# Patient Record
Sex: Female | Born: 1975
Health system: Southern US, Community
[De-identification: ages and names within clinical notes are randomized; demographics above are authoritative.]

## PROBLEM LIST (undated history)

## (undated) HISTORY — PX: ABDOMINOPLASTY: SUR9

## (undated) HISTORY — PX: BREAST SURGERY: SHX581

---

## 2011-11-06 ENCOUNTER — Emergency Department (HOSPITAL_COMMUNITY)
Admission: EM | Admit: 2011-11-06 | Discharge: 2011-11-06 | Disposition: A | Discharge status: Home / Self Care | Payer: 59 | Source: Home / Self Care | Attending: Emergency Medicine | Admitting: Emergency Medicine

## 2011-11-06 DIAGNOSIS — J029 Acute pharyngitis, unspecified: Secondary | ICD-10-CM

## 2011-11-06 MED ORDER — ACETAMINOPHEN-CODEINE #3 300-30 MG PO TABS: 1.0000 | ORAL_TABLET | Freq: Four times a day (QID) | ORAL | Status: AC | PRN

## 2011-11-06 NOTE — ED Notes (Signed)
C/o sore throat for 3 days.  Denies fever or cold sx.

## 2011-11-06 NOTE — ED Provider Notes (Signed)
History     CSN: [id number]  Arrival date & time 11/06/11  1138   First MD Initiated Contact with Patient 11/06/11 1142      Chief Complaint  Patient presents with  . Sore Throat    (Consider location/radiation/quality/duration/timing/severity/associated sxs/prior treatment) Patient is a 35 y.o. female presenting with pharyngitis. The history is provided by the patient.  Sore Throat The current episode started more than 2 days ago. The problem occurs constantly. The problem has not changed since onset.Pertinent negatives include no chest pain, no abdominal pain, no headaches and no shortness of breath. The symptoms are aggravated by swallowing. The symptoms are relieved by nothing. She has tried nothing for the symptoms.    History reviewed. No pertinent past medical history.  Past Surgical History  Procedure Date  . Cesarean section     No family history on file.  History  Substance Use Topics  . Smoking status: Never Smoker   . Smokeless tobacco: Not on file  . Alcohol Use: No    OB History    Grav Para Term Preterm Abortions TAB SAB Ect Mult Living                  Review of Systems  HENT: Positive for sore throat. Negative for ear pain, congestion, rhinorrhea, trouble swallowing, neck pain, neck stiffness, dental problem, postnasal drip and sinus pressure.   Respiratory: Negative for cough and shortness of breath.   Cardiovascular: Negative for chest pain.  Gastrointestinal: Negative for abdominal pain.  Neurological: Negative for headaches.    Allergies  Review of patient's allergies indicates no known allergies.  Home Medications   Current Outpatient Rx  Name Route Sig Dispense Refill  . ACETAMINOPHEN-CODEINE #3 300-30 MG PO TABS Oral Take 1-2 tablets by mouth every 6 (six) hours as needed for pain. 15 tablet 0    BP 112/61  Pulse 88  Temp(Src) 97.9 F (36.6 C) (Oral)  Resp 18  SpO2 100%  LMP 10/21/2011  Physical Exam  Nursing note and  vitals reviewed. Constitutional: She appears well-developed and well-nourished.  HENT:  Head: Normocephalic.  Mouth/Throat: Uvula is midline. Posterior oropharyngeal erythema present. No oropharyngeal exudate, posterior oropharyngeal edema or tonsillar abscesses.  Eyes: Conjunctivae are normal.  Neck: Normal range of motion.  Pulmonary/Chest: Effort normal and breath sounds normal. She has no decreased breath sounds. She has no wheezes. She has no rhonchi. She has no rales.  Lymphadenopathy:    She has no cervical adenopathy.    ED Course  Procedures (including critical care time)   Labs Reviewed  POCT RAPID STREP A ([facility]ARE ONLY)   No results found.   1. Pharyngitis       MDM  Sore throat- afebrile. No further symptoms- negative strep test-        Jimmie Molly, MD 11/06/11 1325

## 2013-08-31 ENCOUNTER — Other Ambulatory Visit: Payer: Self-pay | Admitting: Obstetrics and Gynecology

## 2013-09-05 ENCOUNTER — Encounter (HOSPITAL_COMMUNITY): Payer: Self-pay | Admitting: Pharmacist

## 2013-09-07 ENCOUNTER — Other Ambulatory Visit: Payer: Self-pay | Admitting: Obstetrics and Gynecology

## 2013-09-18 ENCOUNTER — Encounter (HOSPITAL_COMMUNITY): Payer: Self-pay | Admitting: Obstetrics and Gynecology

## 2013-09-18 DIAGNOSIS — N920 Excessive and frequent menstruation with regular cycle: Secondary | ICD-10-CM

## 2013-09-18 NOTE — H&P (Signed)
Christy Phillips is an 37 y.o. female. Presenting for endometrial ablation for disruptive menorrhagia   Pertinent Gynecological History: Menses: regular every 28 days without intermenstrual spotting and usually lasting less than 6 days Contraception: tubal ligation Blood transfusions: none Sexually transmitted diseases: no past history Previous GYN Procedures: tubal ligation  Last pap: normal Date: 07/2013 OB History: G4, P3   Menstrual History: Menarche age: 71  No LMP recorded.    History reviewed. No pertinent past medical history.  Past Surgical History  Procedure Laterality Date  . Cesarean section    . Abdominoplasty    . Breast surgery      reduction 07    History reviewed. No pertinent family history.  Social History:  reports that she has never smoked. She does not have any smokeless tobacco history on file. She reports that she does not drink alcohol or use illicit drugs.  Allergies:  Allergies  Allergen Reactions  . Shellfish Allergy     No prescriptions prior to admission    Review of Systems  Constitutional: Negative.   HENT: Negative.   Eyes: Negative.   Respiratory: Negative.   Cardiovascular: Negative.   Gastrointestinal: Negative.   Genitourinary: Negative.   Musculoskeletal: Negative.   Skin: Negative.   Neurological: Negative.   Endo/Heme/Allergies: Negative.   Psychiatric/Behavioral: Negative.     Blood pressure 121/84, pulse 92, temperature 97.7 F (36.5 C), temperature source Oral, resp. rate 20, height 5\' 8"  (1.727 m), weight 240 lb (108.863 kg), SpO2 100.00%. Physical Exam  Constitutional: She is oriented to person, place, and time. She appears well-developed and well-nourished.  HENT:  Head: Normocephalic and atraumatic.  Eyes: Conjunctivae and EOM are normal.  Neck: Normal range of motion. Neck supple.  Cardiovascular: Normal rate, regular rhythm and normal heart sounds.   Respiratory: Effort normal and breath sounds normal.  GI:  Soft. Bowel sounds are normal.  Genitourinary: Vagina normal.  Uterus enlarged to 8 weeks size  Musculoskeletal: Normal range of motion.  Neurological: She is alert and oriented to person, place, and time.  Skin: Skin is warm and dry.  Psychiatric: She has a normal mood and affect.    Results for orders placed during the hospital encounter of 09/19/13 (from the past 24 hour(s))  PREGNANCY, URINE     Status: None   Collection Time    09/19/13  9:55 AM      Result Value Range   Preg Test, Ur NEGATIVE  NEGATIVE  CBC     Status: None   Collection Time    09/19/13 10:30 AM      Result Value Range   WBC 4.8  4.0 - 10.5 K/uL   RBC 4.84  3.87 - 5.11 MIL/uL   Hemoglobin 13.3  12.0 - 15.0 g/dL   HCT 78.2  95.6 - 21.3 %   MCV 78.7  78.0 - 100.0 fL   MCH 27.5  26.0 - 34.0 pg   MCHC 34.9  30.0 - 36.0 g/dL   RDW 08.6  57.8 - 46.9 %   Platelets 252  150 - 400 K/uL   Office workup:  TSH nl   Pap smear 9/14 WNL   Endometrial biopsy nl   U/S and sonohysterogram:  No endometrial lesions.  Posterior intramural fibroid  Assessment/Plan: Dispruptive menorrhagia Small uterine fibroid    The various methods of treatment have been discussed with the patient and family. After consideration of risks, benefits and other options for treatment, the patient has consented to  Procedure(s): THERMACHOICE ABLATION as a surgical intervention .  I have reviewed the patients' chart and labs.  Questions were answered to the patient's satisfaction.  The pt understands the peculiar risk of this procedure to be uterine perforation and understands this would preclude completion of the procedure.    HAYGOOD,VANESSA P 09/19/2013, 12:53 PM

## 2013-09-19 ENCOUNTER — Encounter (HOSPITAL_COMMUNITY): Payer: Self-pay | Admitting: *Deleted

## 2013-09-19 ENCOUNTER — Ambulatory Visit (HOSPITAL_COMMUNITY)
Admission: RE | Admit: 2013-09-19 | Discharge: 2013-09-19 | Disposition: A | Discharge status: Home / Self Care | Payer: 59 | Source: Ambulatory Visit | Attending: Obstetrics and Gynecology | Admitting: Obstetrics and Gynecology

## 2013-09-19 ENCOUNTER — Encounter (HOSPITAL_COMMUNITY)
Admission: RE | Disposition: A | Discharge status: Home / Self Care | Payer: Self-pay | Source: Ambulatory Visit | Attending: Obstetrics and Gynecology

## 2013-09-19 ENCOUNTER — Ambulatory Visit (HOSPITAL_COMMUNITY): Payer: 59 | Admitting: Certified Registered Nurse Anesthetist

## 2013-09-19 ENCOUNTER — Encounter (HOSPITAL_COMMUNITY): Payer: 59 | Admitting: Certified Registered Nurse Anesthetist

## 2013-09-19 DIAGNOSIS — N920 Excessive and frequent menstruation with regular cycle: Secondary | ICD-10-CM

## 2013-09-19 DIAGNOSIS — D251 Intramural leiomyoma of uterus: Secondary | ICD-10-CM | POA: Insufficient documentation

## 2013-09-19 HISTORY — PX: HYSTEROSCOPY WITH THERMACHOICE: SHX5396

## 2013-09-19 LAB — CBC
MCV: 78.7 fL (ref 78.0–100.0)
Platelets: 252 10*3/uL (ref 150–400)
RDW: 13.9 % (ref 11.5–15.5)
WBC: 4.8 10*3/uL (ref 4.0–10.5)

## 2013-09-19 LAB — PREGNANCY, URINE: Preg Test, Ur: NEGATIVE

## 2013-09-19 SURGERY — HYSTEROSCOPY WITH THERMACHOICE
Anesthesia: General | Wound class: Clean Contaminated

## 2013-09-19 MED ORDER — KETOROLAC TROMETHAMINE 30 MG/ML IJ SOLN
INTRAMUSCULAR | Status: AC
  Filled 2013-09-19: qty 1

## 2013-09-19 MED ORDER — METOCLOPRAMIDE HCL 5 MG/ML IJ SOLN: 10.0000 mg | Freq: Once | INTRAMUSCULAR | Status: DC | PRN

## 2013-09-19 MED ORDER — LIDOCAINE HCL 2 % IJ SOLN
INTRAMUSCULAR | Status: AC
  Filled 2013-09-19: qty 20

## 2013-09-19 MED ORDER — KETOROLAC TROMETHAMINE 30 MG/ML IJ SOLN
INTRAMUSCULAR | Status: DC | PRN
  Administered 2013-09-19: 30 mg via INTRAMUSCULAR
  Administered 2013-09-19: 30 mg via INTRAVENOUS

## 2013-09-19 MED ORDER — LIDOCAINE HCL (CARDIAC) 20 MG/ML IV SOLN
INTRAVENOUS | Status: AC
  Filled 2013-09-19: qty 5

## 2013-09-19 MED ORDER — HYDROCODONE-ACETAMINOPHEN 5-325 MG PO TABS
1.0000 | ORAL_TABLET | Freq: Once | ORAL | Status: AC
  Administered 2013-09-19: 1 via ORAL

## 2013-09-19 MED ORDER — FENTANYL CITRATE 0.05 MG/ML IJ SOLN
25.0000 ug | INTRAMUSCULAR | Status: DC | PRN
  Administered 2013-09-19: 50 ug via INTRAVENOUS

## 2013-09-19 MED ORDER — GLYCOPYRROLATE 0.2 MG/ML IJ SOLN
INTRAMUSCULAR | Status: AC
  Filled 2013-09-19: qty 1

## 2013-09-19 MED ORDER — FENTANYL CITRATE 0.05 MG/ML IJ SOLN
INTRAMUSCULAR | Status: DC | PRN
Start: 2013-09-19 — End: 2013-09-19
  Administered 2013-09-19 (×2): 50 ug via INTRAVENOUS

## 2013-09-19 MED ORDER — MEPERIDINE HCL 25 MG/ML IJ SOLN: 6.2500 mg | INTRAMUSCULAR | Status: DC | PRN

## 2013-09-19 MED ORDER — ONDANSETRON HCL 4 MG/2ML IJ SOLN
INTRAMUSCULAR | Status: AC
  Filled 2013-09-19: qty 2

## 2013-09-19 MED ORDER — DEXAMETHASONE SODIUM PHOSPHATE 10 MG/ML IJ SOLN
INTRAMUSCULAR | Status: AC
  Filled 2013-09-19: qty 1

## 2013-09-19 MED ORDER — PROPOFOL 10 MG/ML IV BOLUS
INTRAVENOUS | Status: DC | PRN
  Administered 2013-09-19: 200 mg via INTRAVENOUS

## 2013-09-19 MED ORDER — FENTANYL CITRATE 0.05 MG/ML IJ SOLN
INTRAMUSCULAR | Status: AC
  Filled 2013-09-19: qty 2

## 2013-09-19 MED ORDER — HYDROCODONE-ACETAMINOPHEN 5-300 MG PO TABS: 1.0000 | ORAL_TABLET | ORAL | Status: DC | PRN

## 2013-09-19 MED ORDER — MIDAZOLAM HCL 2 MG/2ML IJ SOLN
INTRAMUSCULAR | Status: DC | PRN
  Administered 2013-09-19: 2 mg via INTRAVENOUS

## 2013-09-19 MED ORDER — LACTATED RINGERS IV SOLN
INTRAVENOUS | Status: DC
Start: 2013-09-19 — End: 2013-09-19
  Administered 2013-09-19 (×2): via INTRAVENOUS

## 2013-09-19 MED ORDER — DOXYCYCLINE HYCLATE 100 MG PO CAPS: 100.0000 mg | ORAL_CAPSULE | Freq: Two times a day (BID) | ORAL | Status: AC

## 2013-09-19 MED ORDER — LIDOCAINE HCL 2 % IJ SOLN
INTRAMUSCULAR | Status: DC | PRN
  Administered 2013-09-19: 10 mL

## 2013-09-19 MED ORDER — HYDROCODONE-ACETAMINOPHEN 5-325 MG PO TABS
ORAL_TABLET | ORAL | Status: AC
  Filled 2013-09-19: qty 1

## 2013-09-19 MED ORDER — MIDAZOLAM HCL 2 MG/2ML IJ SOLN
INTRAMUSCULAR | Status: AC
  Filled 2013-09-19: qty 2

## 2013-09-19 MED ORDER — FENTANYL CITRATE 0.05 MG/ML IJ SOLN
INTRAMUSCULAR | Status: AC
  Administered 2013-09-19: 50 ug via INTRAVENOUS
  Filled 2013-09-19: qty 2

## 2013-09-19 MED ORDER — LIDOCAINE HCL (CARDIAC) 20 MG/ML IV SOLN
INTRAVENOUS | Status: DC | PRN
  Administered 2013-09-19: 100 mg via INTRAVENOUS

## 2013-09-19 MED ORDER — IBUPROFEN 600 MG PO TABS: ORAL_TABLET | ORAL | Status: DC

## 2013-09-19 MED ORDER — GLYCOPYRROLATE 0.2 MG/ML IJ SOLN
INTRAMUSCULAR | Status: DC | PRN
  Administered 2013-09-19: 0.2 mg via INTRAVENOUS

## 2013-09-19 MED ORDER — ONDANSETRON HCL 4 MG/2ML IJ SOLN
INTRAMUSCULAR | Status: DC | PRN
  Administered 2013-09-19: 4 mg via INTRAVENOUS

## 2013-09-19 MED ORDER — DEXAMETHASONE SODIUM PHOSPHATE 10 MG/ML IJ SOLN
INTRAMUSCULAR | Status: DC | PRN
  Administered 2013-09-19: 10 mg via INTRAVENOUS

## 2013-09-19 SURGICAL SUPPLY — 14 items
BOOTIES KNEE HIGH SLOAN (MISCELLANEOUS) ×2 IMPLANT
CANISTER SUCT 3000ML (MISCELLANEOUS) ×2 IMPLANT
CATH ROBINSON RED A/P 16FR (CATHETERS) ×2 IMPLANT
CATH THERMACHOICE III (CATHETERS) ×2 IMPLANT
CLOTH BEACON ORANGE TIMEOUT ST (SAFETY) ×2 IMPLANT
CONTAINER PREFILL 10% NBF 60ML (FORM) ×4 IMPLANT
DRESSING TELFA 8X3 (GAUZE/BANDAGES/DRESSINGS) ×2 IMPLANT
GAUZE VASELINE 3X9 (GAUZE/BANDAGES/DRESSINGS) IMPLANT
GLOVE SURG SS PI 6.5 STRL IVOR (GLOVE) ×4 IMPLANT
GOWN STRL REIN XL XLG (GOWN DISPOSABLE) ×4 IMPLANT
PACK HYSTEROSCOPY LF (CUSTOM PROCEDURE TRAY) ×2 IMPLANT
PAD OB MATERNITY 4.3X12.25 (PERSONAL CARE ITEMS) ×2 IMPLANT
TOWEL OR 17X24 6PK STRL BLUE (TOWEL DISPOSABLE) ×4 IMPLANT
WATER STERILE IRR 1000ML POUR (IV SOLUTION) ×2 IMPLANT

## 2013-09-19 NOTE — Preoperative (Signed)
Beta Blockers   Reason not to administer Beta Blockers:Not Applicable 

## 2013-09-19 NOTE — Anesthesia Procedure Notes (Addendum)
Procedure Name: LMA Insertion Date/Time: 09/19/2013 1:36 PM Performed by: Law Corsino ADEDAYO Gilliam Hawkes Pre-anesthesia Checklist: Patient identified, Emergency Drugs available, Suction available, Timeout performed and Patient being monitored Patient Re-evaluated:Patient Re-evaluated prior to inductionOxygen Delivery Method: Circle system utilized Preoxygenation: Pre-oxygenation with 100% oxygen Intubation Type: IV induction Ventilation: Mask ventilation without difficulty LMA: LMA inserted and LMA with gastric port inserted LMA Size: 4.0 Number of attempts: 1 Placement Confirmation: positive ETCO2 and breath sounds checked- equal and bilateral Tube secured with: Tape Dental Injury: Teeth and Oropharynx as per pre-operative assessment

## 2013-09-19 NOTE — Anesthesia Postprocedure Evaluation (Signed)
  Anesthesia Post-op Note  Patient: Christy Phillips  Procedure(s) Performed: Procedure(s): THERMACHOICE ABLATION (N/A)  Patient Location: PACU  Anesthesia Type:General  Level of Consciousness: awake, alert  and oriented  Airway and Oxygen Therapy: Patient Spontanous Breathing  Post-op Pain: none  Post-op Assessment: Post-op Vital signs reviewed, Patient's Cardiovascular Status Stable, Respiratory Function Stable, Patent Airway, No signs of Nausea or vomiting and Pain level controlled  Post-op Vital Signs: Reviewed and stable  Complications: No apparent anesthesia complications

## 2013-09-19 NOTE — Anesthesia Preprocedure Evaluation (Signed)
Anesthesia Evaluation  Patient identified by MRN, date of birth, ID band Patient awake    Reviewed: Allergy & Precautions, H&P , NPO status , Patient's Chart, lab work & pertinent test results  Airway Mallampati: III TM Distance: >3 FB Neck ROM: Full    Dental no notable dental hx. (+) Teeth Intact   Pulmonary neg pulmonary ROS,  breath sounds clear to auscultation  Pulmonary exam normal       Cardiovascular negative cardio ROS  Rhythm:Regular Rate:Normal     Neuro/Psych negative neurological ROS  negative psych ROS   GI/Hepatic negative GI ROS, Neg liver ROS,   Endo/Other  negative endocrine ROSObesity   Renal/GU negative Renal ROS  negative genitourinary   Musculoskeletal negative musculoskeletal ROS (+)   Abdominal (+) + obese,   Peds  Hematology negative hematology ROS (+)   Anesthesia Other Findings   Reproductive/Obstetrics Menorrhagia                           Anesthesia Physical Anesthesia Plan  ASA: II  Anesthesia Plan: General   Post-op Pain Management:    Induction: Intravenous  Airway Management Planned: LMA  Additional Equipment:   Intra-op Plan:   Post-operative Plan: Extubation in OR  Informed Consent: I have reviewed the patients History and Physical, chart, labs and discussed the procedure including the risks, benefits and alternatives for the proposed anesthesia with the patient or authorized representative who has indicated his/her understanding and acceptance.   Dental advisory given  Plan Discussed with: Anesthesiologist, CRNA and Surgeon  Anesthesia Plan Comments:         Anesthesia Quick Evaluation

## 2013-09-19 NOTE — Op Note (Signed)
09/19/2013  2:25 PM  PATIENT:  Christy Phillips  37 y.o. female  PRE-OPERATIVE DIAGNOSIS:  Menorrhagia  POST-OPERATIVE DIAGNOSIS:  Menorrhagia  PROCEDURE:  Procedure(s): THERMACHOICE ABLATION  SURGEON:  Surgeon(s): Hal Morales, MD  ASSISTANTS: None  ANESTHESIA:   local and general  ESTIMATED BLOOD LOSS: * No blood loss amount entered *  COMPLICATIONS: None  BLOOD ADMINISTERED:none  FINDINGS: The uterus sounded to 10 cm  LOCAL MEDICATIONS USED:  MARCAINE    and Amount: 10 ml  INTRAOPERATIVE MEDICATIONS: Toradol 30 mg IV and 30 mg IM  SPECIMEN:  No Specimen  DISPOSITION OF SPECIMEN:  N/A  COUNTS:  YES  DESCRIPTION OF PROCEDURE:  The patient was taken to the operating room after appropriate identification placed on the operating table. After the placement of equipment for general anesthesia care, she was placed in the lithotomy position. The perineum and vagina were prepped with multiple layers of Betadine and the bladder drained with an in and out catheter. The perineum was draped as a sterile field. A weighted speculum was placed in the posterior vagina and a paracervical block achieved with a total of 10 cc of 2% Xylocaine and the 5 and 7:00 positions. The cervix was grasped with a single-tooth tenaculum. The uterus was then sounded. Need directly apparatus was primed and the balloon felt to be intact and the fluid was withdrawn to a negative pressure of 1 and the apparatus placed into the endometrial cavity and D5W was slowly introduced into the balloon via the trumpet apparatus to achieve a pressure greater then 150 mmHg.  The fluid was heated to 87. The endometrial ablation was then undertaken for a total of 8 minutes. The apparatus was allowed to cool and the fluid withdrawn.. The apparatus was removed from the endometrial cavity and all instruments removed from the vagina. The patient was returned from her anesthesia and taken to the recovery room in satisfactory  condition having tolerated the procedure well sponge and instrument counts correct.    PLAN OF CARE:  Discharge home after post anesthesia care in PATIENT DISPOSITION:  PACU - hemodynamically stable.   Delay start of Pharmacological VTE agent (>24hrs) due to surgical blood loss or risk of bleeding:  No VTE agent secondary to brevity of the procedure   Tanmay Halteman P, MD 2:25 PM

## 2013-09-19 NOTE — Transfer of Care (Signed)
Immediate Anesthesia Transfer of Care Note  Patient: Christy Phillips  Procedure(s) Performed: Procedure(s): THERMACHOICE ABLATION (N/A)  Patient Location: PACU  Anesthesia Type:General  Level of Consciousness: awake, alert  and oriented  Airway & Oxygen Therapy: Patient Spontanous Breathing and Patient connected to nasal cannula oxygen  Post-op Assessment: Report given to PACU RN, Post -op Vital signs reviewed and stable and Patient moving all extremities X 4  Post vital signs: Reviewed and stable  Complications: No apparent anesthesia complications

## 2013-09-20 ENCOUNTER — Encounter (HOSPITAL_COMMUNITY): Payer: Self-pay | Admitting: Obstetrics and Gynecology

## 2013-11-03 ENCOUNTER — Encounter (HOSPITAL_COMMUNITY): Payer: Self-pay | Admitting: Emergency Medicine

## 2013-11-03 ENCOUNTER — Emergency Department (HOSPITAL_COMMUNITY)
Admission: EM | Admit: 2013-11-03 | Discharge: 2013-11-03 | Disposition: A | Discharge status: Home / Self Care | Payer: 59 | Attending: Emergency Medicine | Admitting: Emergency Medicine

## 2013-11-03 DIAGNOSIS — M5412 Radiculopathy, cervical region: Secondary | ICD-10-CM | POA: Insufficient documentation

## 2013-11-03 MED ORDER — OXYCODONE-ACETAMINOPHEN 5-325 MG PO TABS
2.0000 | ORAL_TABLET | Freq: Once | ORAL | Status: AC
  Administered 2013-11-03: 2 via ORAL
  Filled 2013-11-03: qty 2

## 2013-11-03 MED ORDER — PREDNISONE 20 MG PO TABS
60.0000 mg | ORAL_TABLET | Freq: Once | ORAL | Status: AC
  Administered 2013-11-03: 60 mg via ORAL
  Filled 2013-11-03: qty 3

## 2013-11-03 MED ORDER — OXYCODONE-ACETAMINOPHEN 5-325 MG PO TABS: 2.0000 | ORAL_TABLET | ORAL | Status: DC | PRN

## 2013-11-03 MED ORDER — PREDNISONE 20 MG PO TABS: ORAL_TABLET | ORAL | Status: DC

## 2013-11-03 NOTE — ED Provider Notes (Signed)
CSN: [id number]     Arrival date & time 11/03/13  0430 History   First MD Initiated Contact with Patient 11/03/13 [phone]     Chief Complaint  Patient presents with  . Neck Pain   (Consider location/radiation/quality/duration/timing/severity/associated sxs/prior Treatment) HPI One-week gradual onset moderately severe constant neck pain radiating to left hand with no weakness no numbness no fever no IV drug abuse no change in bowel or bladder function; better if sleeps with arm held overhead; nonexertional.  Nontender neck normal left arm exam History reviewed. No pertinent past medical history. Past Surgical History  Procedure Laterality Date  . Cesarean section    . Abdominoplasty    . Breast surgery      reduction 07  . Hysteroscopy with thermachoice N/A 09/19/2013    Procedure: THERMACHOICE ABLATION;  Surgeon: Hal Morales, MD;  Location: [facility];  Service: Gynecology;  Laterality: N/A;   No family history on file. History  Substance Use Topics  . Smoking status: Never Smoker   . Smokeless tobacco: Not on file  . Alcohol Use: No   OB History   Grav Para Term Preterm Abortions TAB SAB Ect Mult Living   4 4 4       3      Review of Systems 10 Systems reviewed and are negative for acute change except as noted in the HPI. Allergies  Shellfish allergy  Home Medications   Current Outpatient Rx  Name  Route  Sig  Dispense  Refill  . ibuprofen (ADVIL,MOTRIN) 600 MG tablet      Ibuprofen 600 mg orally every 6 hours for 3 days and then every 6 hours as needed for pain   50 tablet   0   . oxyCODONE-acetaminophen (PERCOCET) 5-325 MG per tablet   Oral   Take 2 tablets by mouth every 4 (four) hours as needed.   20 tablet   0   . predniSONE (DELTASONE) 20 MG tablet      2 tabs po daily x 4 days   8 tablet   0    BP 133/74  Pulse 94  Temp(Src) 97.3 F (36.3 C) (Oral)  Resp 18  SpO2 98%  LMP 10/04/2013 Physical Exam  Nursing note and vitals  reviewed. Constitutional:  Awake, alert, nontoxic appearance.  HENT:  Head: Atraumatic.  Eyes: Right eye exhibits no discharge. Left eye exhibits no discharge.  Neck: Neck supple.  Neck and C-spine nontender  Cardiovascular: Normal rate and regular rhythm.   No murmur heard. Pulmonary/Chest: Effort normal and breath sounds normal. No respiratory distress. She has no wheezes. She has no rales. She exhibits no tenderness.  Abdominal: Soft. Bowel sounds are normal. She exhibits no distension. There is no tenderness. There is no rebound and no guarding.  Musculoskeletal: She exhibits no tenderness.  Baseline ROM, no obvious new focal weakness. Left arm radial pulse intact, CR<2secs, normal light touch and 5/5 motor distributions of the axillary, median, radial, and ulnar nerve function.  Neurological: She is alert.  Mental status and motor strength appears baseline for patient and situation.  Skin: No rash noted.  Psychiatric: She has a normal mood and affect.    ED Course  Procedures (including critical care time) Patient informed of clinical course, understand medical decision-making process, and agree with plan. Labs Review Labs Reviewed - No data to display Imaging Review No results found.  EKG Interpretation   None       MDM   1. Cervical radiculopathy  I doubt any other EMC precluding discharge at this time including, but not necessarily limited to the following:CVA, SBI, ACS, myelopathy.    Hurman Horn, MD 11/07/13 [phone]

## 2013-11-03 NOTE — ED Notes (Signed)
Pt reports neck pain that started last Friday that radiates occasionally down L arm. Pain decreases with raised arm.

## 2013-11-13 ENCOUNTER — Other Ambulatory Visit: Payer: Self-pay | Admitting: Family Medicine

## 2013-11-13 DIAGNOSIS — R200 Anesthesia of skin: Secondary | ICD-10-CM

## 2013-11-18 ENCOUNTER — Other Ambulatory Visit: Payer: 59

## 2013-11-20 ENCOUNTER — Ambulatory Visit
Admission: RE | Admit: 2013-11-20 | Discharge: 2013-11-20 | Disposition: A | Discharge status: Home / Self Care | Payer: 59 | Source: Ambulatory Visit | Attending: Family Medicine | Admitting: Family Medicine

## 2013-11-20 DIAGNOSIS — R200 Anesthesia of skin: Secondary | ICD-10-CM

## 2014-09-17 ENCOUNTER — Encounter (HOSPITAL_COMMUNITY): Payer: Self-pay | Admitting: Emergency Medicine

## 2015-12-18 ENCOUNTER — Encounter (HOSPITAL_COMMUNITY): Payer: Self-pay | Admitting: Emergency Medicine

## 2015-12-18 ENCOUNTER — Emergency Department (HOSPITAL_COMMUNITY)
Admission: EM | Admit: 2015-12-18 | Discharge: 2015-12-18 | Disposition: A | Discharge status: Home / Self Care | Payer: 59 | Attending: Emergency Medicine | Admitting: Emergency Medicine

## 2015-12-18 ENCOUNTER — Emergency Department (HOSPITAL_COMMUNITY): Payer: 59

## 2015-12-18 DIAGNOSIS — R110 Nausea: Secondary | ICD-10-CM | POA: Diagnosis not present

## 2015-12-18 DIAGNOSIS — R109 Unspecified abdominal pain: Secondary | ICD-10-CM | POA: Diagnosis not present

## 2015-12-18 DIAGNOSIS — R000 Tachycardia, unspecified: Secondary | ICD-10-CM | POA: Diagnosis not present

## 2015-12-18 DIAGNOSIS — Z3202 Encounter for pregnancy test, result negative: Secondary | ICD-10-CM | POA: Insufficient documentation

## 2015-12-18 LAB — COMPREHENSIVE METABOLIC PANEL
ALT: 32 U/L (ref 14–54)
ANION GAP: 10 (ref 5–15)
AST: 23 U/L (ref 15–41)
Albumin: 4.1 g/dL (ref 3.5–5.0)
Alkaline Phosphatase: 48 U/L (ref 38–126)
BILIRUBIN TOTAL: 0.5 mg/dL (ref 0.3–1.2)
BUN: 9 mg/dL (ref 6–20)
CO2: 24 mmol/L (ref 22–32)
Calcium: 9.7 mg/dL (ref 8.9–10.3)
Chloride: 105 mmol/L (ref 101–111)
Creatinine, Ser: 0.75 mg/dL (ref 0.44–1.00)
GFR calc Af Amer: >60 mL/min (ref >60)
Glucose, Bld: 111 mg/dL — ABNORMAL HIGH (ref 65–99)
POTASSIUM: 3.9 mmol/L (ref 3.5–5.1)
Sodium: 139 mmol/L (ref 135–145)
TOTAL PROTEIN: 7.6 g/dL (ref 6.5–8.1)

## 2015-12-18 LAB — CBC WITH DIFFERENTIAL/PLATELET
BASOS PCT: 0 %
Basophils Absolute: 0 10*3/uL (ref 0.0–0.1)
Eosinophils Absolute: 0 10*3/uL (ref 0.0–0.7)
Eosinophils Relative: 0 %
HCT: 39.8 % (ref 36.0–46.0)
Hemoglobin: 13.8 g/dL (ref 12.0–15.0)
Lymphocytes Relative: 15 %
Lymphs Abs: 1 10*3/uL (ref 0.7–4.0)
MCH: 28.9 pg (ref 26.0–34.0)
MCHC: 34.7 g/dL (ref 30.0–36.0)
MCV: 83.4 fL (ref 78.0–100.0)
MONO ABS: 0.3 10*3/uL (ref 0.1–1.0)
MONOS PCT: 4 %
Neutro Abs: 5.4 10*3/uL (ref 1.7–7.7)
Neutrophils Relative %: 81 %
Platelets: 239 10*3/uL (ref 150–400)
RBC: 4.77 MIL/uL (ref 3.87–5.11)
RDW: 13.1 % (ref 11.5–15.5)
WBC: 6.7 10*3/uL (ref 4.0–10.5)

## 2015-12-18 LAB — URINALYSIS, ROUTINE W REFLEX MICROSCOPIC
BILIRUBIN URINE: NEGATIVE
Glucose, UA: NEGATIVE mg/dL
Hgb urine dipstick: NEGATIVE
Ketones, ur: NEGATIVE mg/dL
Leukocytes, UA: NEGATIVE
Nitrite: NEGATIVE
PROTEIN: NEGATIVE mg/dL
Specific Gravity, Urine: 1.005 (ref 1.005–1.030)
pH: 5 (ref 5.0–8.0)

## 2015-12-18 LAB — POC URINE PREG, ED: PREG TEST UR: NEGATIVE

## 2015-12-18 LAB — LIPASE, BLOOD: LIPASE: 35 U/L (ref 11–51)

## 2015-12-18 MED ORDER — KETOROLAC TROMETHAMINE 30 MG/ML IJ SOLN
30.0000 mg | Freq: Once | INTRAMUSCULAR | Status: AC
  Administered 2015-12-18: 30 mg via INTRAVENOUS
  Filled 2015-12-18: qty 1

## 2015-12-18 MED ORDER — OXYCODONE-ACETAMINOPHEN 5-325 MG PO TABS: 1.0000 | ORAL_TABLET | ORAL | Status: DC | PRN

## 2015-12-18 MED ORDER — ONDANSETRON HCL 4 MG/2ML IJ SOLN
4.0000 mg | Freq: Once | INTRAMUSCULAR | Status: AC
  Administered 2015-12-18: 4 mg via INTRAVENOUS
  Filled 2015-12-18: qty 2

## 2015-12-18 MED ORDER — SODIUM CHLORIDE 0.9 % IV BOLUS (SEPSIS)
1000.0000 mL | Freq: Once | INTRAVENOUS | Status: AC
  Administered 2015-12-18: 1000 mL via INTRAVENOUS

## 2015-12-18 MED ORDER — ONDANSETRON 4 MG PO TBDP: 4.0000 mg | ORAL_TABLET | Freq: Three times a day (TID) | ORAL | Status: DC | PRN

## 2015-12-18 MED ORDER — IBUPROFEN 800 MG PO TABS: 800.0000 mg | ORAL_TABLET | Freq: Three times a day (TID) | ORAL | Status: AC

## 2015-12-18 MED ORDER — MORPHINE SULFATE (PF) 4 MG/ML IV SOLN
4.0000 mg | INTRAVENOUS | Status: DC | PRN
Start: 2015-12-18 — End: 2015-12-18
  Administered 2015-12-18: 4 mg via INTRAVENOUS
  Filled 2015-12-18: qty 1

## 2015-12-18 MED FILL — OXYCODONE/APAP 5/325MG: 5-325 | 1 days supply | Qty: 8 | Fill #0

## 2015-12-18 MED FILL — IBUPROFEN 800 MG TABLET: 800 | 7 days supply | Qty: 21 | Fill #0

## 2015-12-18 MED FILL — ONDANSETRON ODT 4 MG TABLET: 4 | 6 days supply | Qty: 20 | Fill #0

## 2015-12-18 NOTE — ED Notes (Signed)
Pt ambulates independently and with steady gait at time of discharge. Discharge instructions and follow up information reviewed with patient. No other questions or concerns voiced at this time. RX x 3 given. 

## 2015-12-18 NOTE — ED Notes (Signed)
Pt reports sudden onset of left flank pain yesterday which has gotten worse with time. Pt alert x4.

## 2015-12-18 NOTE — ED Provider Notes (Signed)
CSN: [id number]     Arrival date & time 12/18/15  1005 History   First MD Initiated Contact with Patient 12/18/15 1028     Chief Complaint  Patient presents with  . Flank Pain     (Consider location/radiation/quality/duration/timing/severity/associated sxs/prior Treatment) HPI   EMI Christy Phillips is a 40 y.o. female, patient with no pertinent past medical history, presenting to the ED with lower right back pain beginning yesterday afternoon. Pt rates her pain is 10/10, "feels like someone grabbing and squeezing," intermittent, radiates to the right flank. Pt also complains of some nausea. Pt took ibuprofen, which helped some. Pt denies fever/chills, vomiting, diarrhea, constipation, abdominal or pelvic pain, vaginal discharge, dysuria, hematuria, or any other complaints. LMP was two weeks ago.     History reviewed. No pertinent past medical history. Past Surgical History  Procedure Laterality Date  . Cesarean section    . Abdominoplasty    . Breast surgery      reduction 07  . Hysteroscopy with thermachoice N/A 09/19/2013    Procedure: THERMACHOICE ABLATION;  Surgeon: Eldred Manges, MD;  Location: [facility];  Service: Gynecology;  Laterality: N/A;   No family history on file. Social History  Substance Use Topics  . Smoking status: Never Smoker   . Smokeless tobacco: None  . Alcohol Use: No   OB History    Gravida Para Term Preterm AB TAB SAB Ectopic Multiple Living   4 4 4       3      Review of Systems  Constitutional: Negative for fever, chills and diaphoresis.  Respiratory: Negative for shortness of breath.   Gastrointestinal: Positive for nausea. Negative for vomiting, abdominal pain, diarrhea, constipation and blood in stool.  Genitourinary: Positive for flank pain (Right). Negative for dysuria, hematuria, vaginal bleeding, vaginal discharge, vaginal pain and pelvic pain.  Neurological: Negative for dizziness and light-headedness.  All other systems reviewed and are  negative.     Allergies  Shellfish allergy  Home Medications   Prior to Admission medications   Medication Sig Start Date End Date Taking? Authorizing Provider  ibuprofen (ADVIL,MOTRIN) 600 MG tablet Ibuprofen 600 mg orally every 6 hours for 3 days and then every 6 hours as needed for pain 09/19/13  Yes Eldred Manges, MD  ibuprofen (ADVIL,MOTRIN) 800 MG tablet Take 1 tablet (800 mg total) by mouth 3 (three) times daily. 12/18/15   Laurana Magistro C Kari Montero, PA-C  ondansetron (ZOFRAN ODT) 4 MG disintegrating tablet Take 1 tablet (4 mg total) by mouth every 8 (eight) hours as needed for nausea or vomiting. 12/18/15   Nazim Kadlec C Adasha Boehme, PA-C  oxyCODONE-acetaminophen (PERCOCET/ROXICET) 5-325 MG tablet Take 1-2 tablets by mouth every 4 (four) hours as needed for severe pain. 12/18/15   Mechelle Pates C Maude Hettich, PA-C   BP 97/58 mmHg  Pulse 77  Temp(Src) 98.6 F (37 C) (Oral)  Resp 14  SpO2 100%  LMP 12/04/2015 (Exact Date) Physical Exam  Constitutional: She appears well-developed and well-nourished. No distress.  Patient is noted to be standing up, appears uncomfortable.  HENT:  Head: Normocephalic and atraumatic.  Eyes: Conjunctivae are normal. Pupils are equal, round, and reactive to light.  Cardiovascular: Regular rhythm and normal heart sounds.  Tachycardia present.   Pulmonary/Chest: Effort normal and breath sounds normal. No respiratory distress.  Abdominal: Soft. Bowel sounds are normal. There is no tenderness. There is CVA tenderness (Right).  Musculoskeletal: She exhibits no edema or tenderness.  Neurological: She is alert.  Skin: Skin is  warm and dry. She is not diaphoretic.  Nursing note and vitals reviewed.   ED Course  Procedures (including critical care time) Labs Review Labs Reviewed  COMPREHENSIVE METABOLIC PANEL - Abnormal; Notable for the following:    Glucose, Bld 111 (*)    All other components within normal limits  CBC WITH DIFFERENTIAL/PLATELET  LIPASE, BLOOD  URINALYSIS, ROUTINE W REFLEX  MICROSCOPIC (NOT AT [facility])  POC URINE PREG, ED    Imaging Review Ct Renal Stone Study  12/18/2015  CLINICAL DATA:  Right flank pain since yesterday with nausea and vomiting. Evaluate for ureteral calculus. History of abdominal plasty. EXAM: CT ABDOMEN AND PELVIS WITHOUT CONTRAST TECHNIQUE: Multidetector CT imaging of the abdomen and pelvis was performed following the standard protocol without IV contrast. COMPARISON:  None. FINDINGS: Lower chest: Clear lung bases. No significant pleural or pericardial effusion. Hepatobiliary: The liver appears unremarkable as imaged in the noncontrast state. No evidence of gallstones, gallbladder wall thickening or biliary dilatation. Pancreas: Unremarkable. No pancreatic ductal dilatation or surrounding inflammatory changes. Spleen: Normal in size without focal abnormality. Adrenals/Urinary Tract: Both adrenal glands appear normal. The kidneys appear normal without evidence of urinary tract calculus, suspicious lesion or hydronephrosis. No bladder abnormalities are seen. Stomach/Bowel: Bowel assessment limited by the lack of oral and intravenous contrast. No bowel wall thickening or surrounding inflammatory change demonstrated. The appendix appears normal. Vascular/Lymphatic: There are no enlarged abdominal or pelvic lymph nodes. No significant vascular findings on noncontrast imaging. Reproductive: There is mild lobulated enlargement of the uterus which measures 10.9 x 8.2 x 9.0 cm, suspicious for possible fibroids. No significant adnexal findings are seen. Other: No evidence of abdominal wall mass or hernia. Musculoskeletal: No acute osseous findings are seen. There is an exaggerated lower lumbar lordosis with a probable right-sided L5 pars defect. IMPRESSION: 1. No evidence of urinary tract calculus or hydronephrosis. 2. No other acute abdominal findings are seen. The appendix appears normal. 3. Suspected uterine fibroids. 4. Congenital asymmetry of the posterior elements at  the lumbosacral junction with a suspected right-sided L5 pars defect. Electronically Signed   By: Richardean Sale M.D.   On: 12/18/2015 12:35   I have personally reviewed and evaluated these images and lab results as part of my medical decision-making.   EKG Interpretation None      MDM   Final diagnoses:  Right flank pain    Terrisha L Schuitema presents with right lower back and right flank pain since yesterday.  Findings and plan of care discussed with Dorie Rank, MD.  This patient's presentation is suspicious for renal stones. Patient has no signs of an infectious process, such as pyelonephritis. Patient's tachycardia is probably due to pain. No abnormalities on patient's labs. Upon reevaluation, patient states that her pain and nausea have been relieved. CT shows no evidence of renal stones. Patient to be discharged with prescriptions for pain management, nausea, and instructions to follow-up with PCP. Patient's tachycardia resolved upon adequate pain control. It is possible that the patient's pain is muscular in origin. The patient was given instructions for home care as well as return precautions. Patient voices understanding of these instructions, accepts the plan, and is comfortable with discharge.  Filed Vitals:   12/18/15 1011 12/18/15 1200 12/18/15 1308  BP: 144/89 101/73 97/58  Pulse: 114 92 77  Temp: 98.6 F (37 C)    TempSrc: Oral    Resp: 17  14  SpO2: 98% 97% 100%       Yesika Rispoli C Cadyn Fann,  PA-C 12/18/15 1437  Dorie Rank, MD 12/18/15 1655

## 2015-12-18 NOTE — Discharge Instructions (Signed)
You have been seen today for right flank pain. Your imaging and lab tests showed no abnormalities. Follow up with PCP as soon as possible for reevaluation and chronic management of this issue. Return to ED should symptoms worsen.

## 2015-12-20 DIAGNOSIS — M545 Low back pain: Secondary | ICD-10-CM | POA: Diagnosis not present

## 2016-01-01 DIAGNOSIS — Z6830 Body mass index (BMI) 30.0-30.9, adult: Secondary | ICD-10-CM | POA: Diagnosis not present

## 2016-01-01 DIAGNOSIS — Z01411 Encounter for gynecological examination (general) (routine) with abnormal findings: Secondary | ICD-10-CM | POA: Diagnosis not present

## 2016-01-01 DIAGNOSIS — D259 Leiomyoma of uterus, unspecified: Secondary | ICD-10-CM | POA: Diagnosis not present

## 2016-01-01 DIAGNOSIS — Z8742 Personal history of other diseases of the female genital tract: Secondary | ICD-10-CM | POA: Diagnosis not present

## 2016-03-13 DIAGNOSIS — H5213 Myopia, bilateral: Secondary | ICD-10-CM | POA: Diagnosis not present

## 2016-07-28 MED FILL — ATOVAQUONE-PROGUANIL 250-10: 250-100 | 21 days supply | Qty: 21 | Fill #0

## 2016-07-28 MED FILL — CIPROFLOXACIN HCL 500 MG TA: 500 | 3 days supply | Qty: 6 | Fill #0

## 2017-01-04 DIAGNOSIS — N920 Excessive and frequent menstruation with regular cycle: Secondary | ICD-10-CM | POA: Diagnosis not present

## 2017-01-04 DIAGNOSIS — D259 Leiomyoma of uterus, unspecified: Secondary | ICD-10-CM | POA: Diagnosis not present

## 2017-01-04 DIAGNOSIS — Z1231 Encounter for screening mammogram for malignant neoplasm of breast: Secondary | ICD-10-CM | POA: Diagnosis not present

## 2017-01-04 DIAGNOSIS — Z01411 Encounter for gynecological examination (general) (routine) with abnormal findings: Secondary | ICD-10-CM | POA: Diagnosis not present

## 2017-01-04 DIAGNOSIS — Z6831 Body mass index (BMI) 31.0-31.9, adult: Secondary | ICD-10-CM | POA: Diagnosis not present

## 2017-01-27 DIAGNOSIS — Z0000 Encounter for general adult medical examination without abnormal findings: Secondary | ICD-10-CM | POA: Diagnosis not present

## 2017-03-30 DIAGNOSIS — H5213 Myopia, bilateral: Secondary | ICD-10-CM | POA: Diagnosis not present

## 2017-03-30 DIAGNOSIS — H52223 Regular astigmatism, bilateral: Secondary | ICD-10-CM | POA: Diagnosis not present

## 2017-03-31 MED FILL — ONDANSETRON HCL 4 MG TABLET: 4 | 3 days supply | Qty: 15 | Fill #0

## 2017-03-31 MED FILL — ATOVAQUONE-PROGUANIL 250-10: 250-100 | 19 days supply | Qty: 19 | Fill #0

## 2017-03-31 MED FILL — AZITHROMYCIN 500 MG TABLET: 500 | 3 days supply | Qty: 3 | Fill #0

## 2017-09-25 IMAGING — CT CT RENAL STONE PROTOCOL
2 of 4 series · 9 of 46 positions shown, 10 images · non-contrast
Comparison: None.

CLINICAL DATA: Right flank pain since yesterday with nausea and
vomiting. Evaluate for ureteral calculus. History of abdominal
plasty.

EXAM:
CT ABDOMEN AND PELVIS WITHOUT CONTRAST
TECHNIQUE: Multidetector CT imaging of the abdomen and pelvis was performed
following the standard protocol without IV contrast.

[Series 201: stone study, idose (2) · axial · 0.76mm/px · z∈[-379,+1]mm · 6 of 92 slices shown, 7 images]
[im 8/92  soft-tissue]
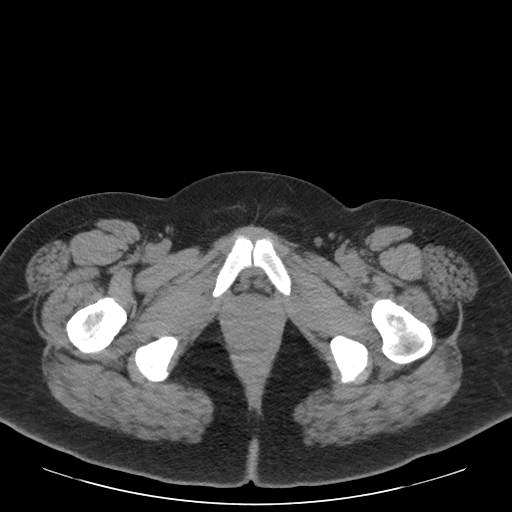
[im 8/92  bone]
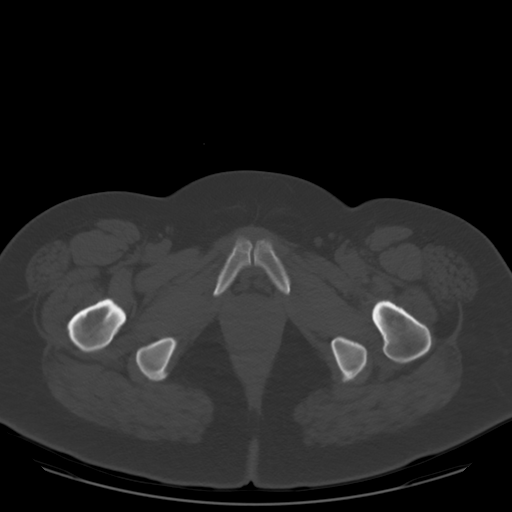
[im 23/92  soft-tissue]
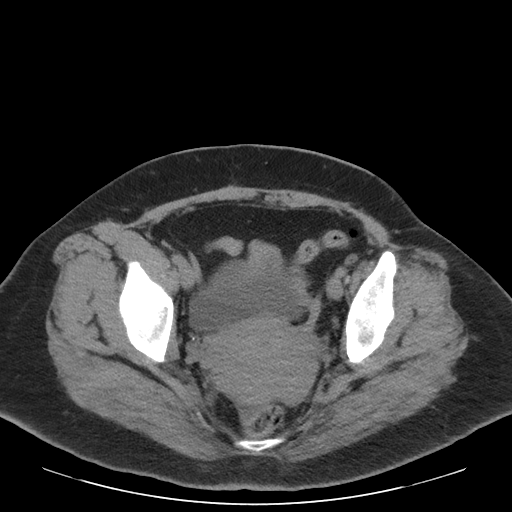
[im 38/92  soft-tissue]
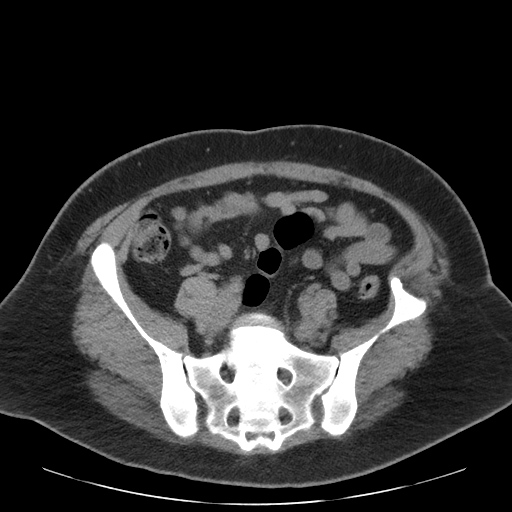
[im 54/92  soft-tissue]
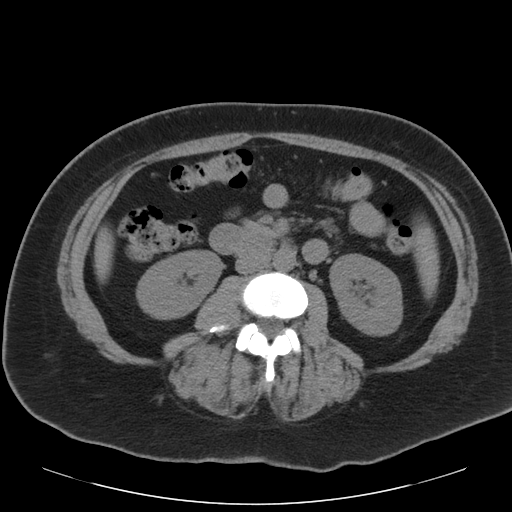
[im 69/92  soft-tissue]
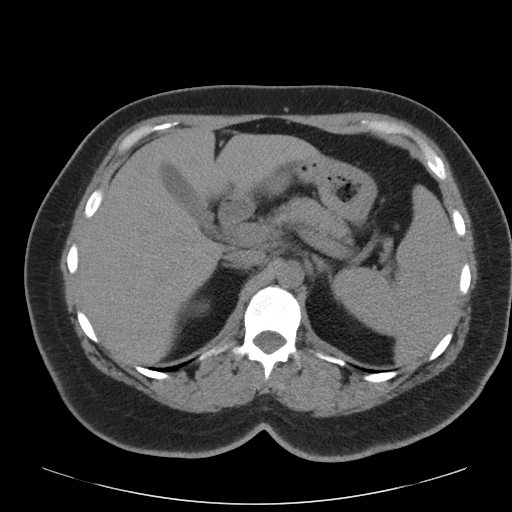
[im 84/92  soft-tissue]
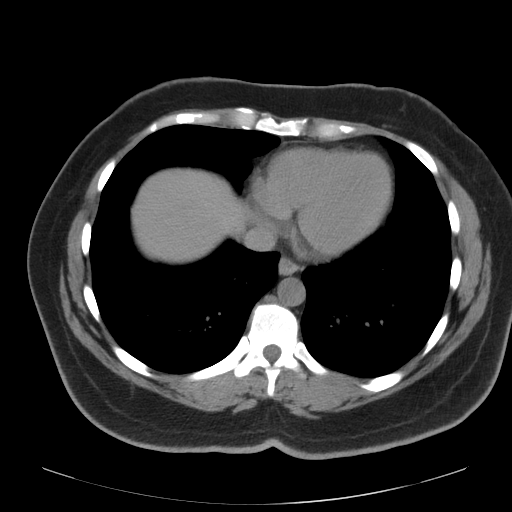

[Series 203: coronals, idose (2) · coronal · 0.45mm/px · 3 of 146 slices shown]
[im 49/146  soft-tissue]
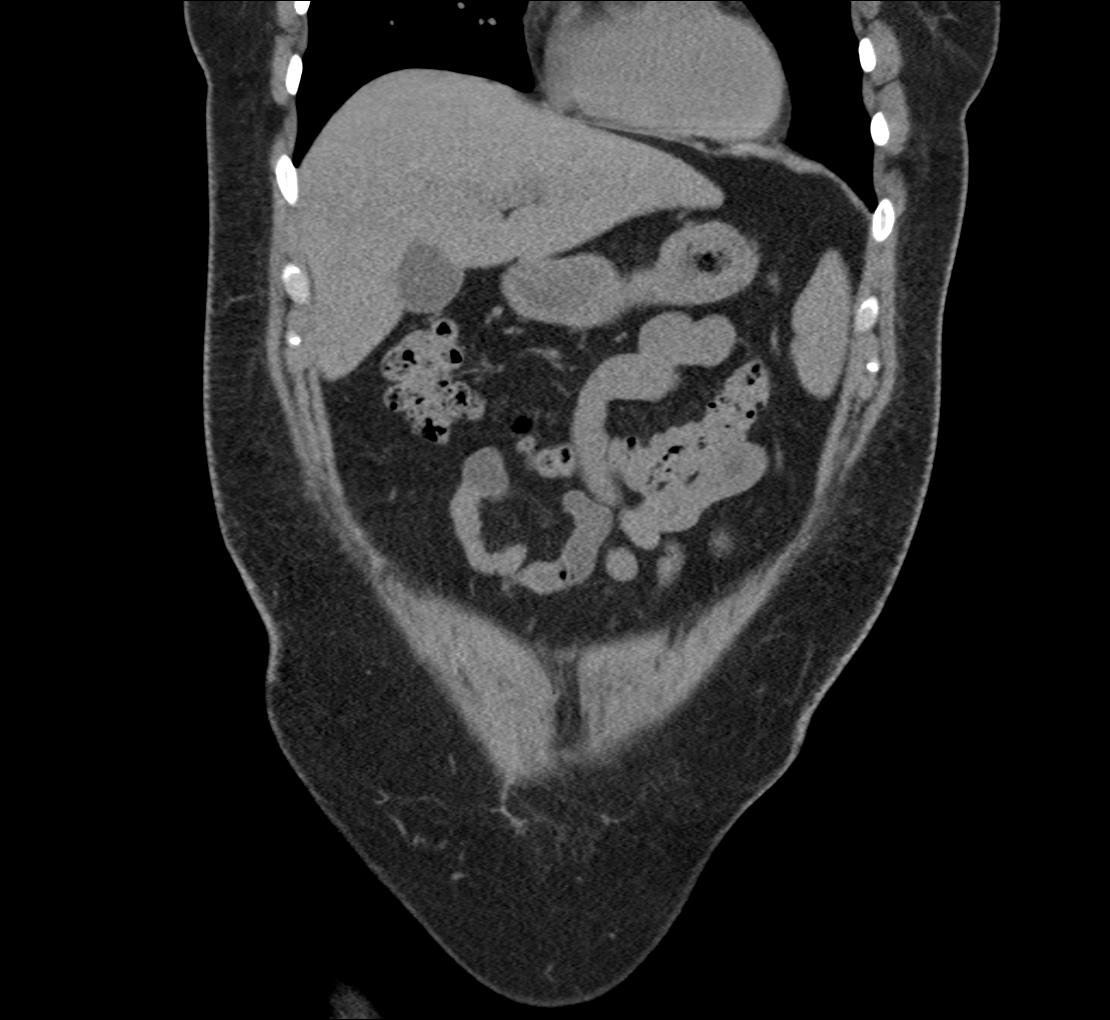
[im 65/146  soft-tissue]
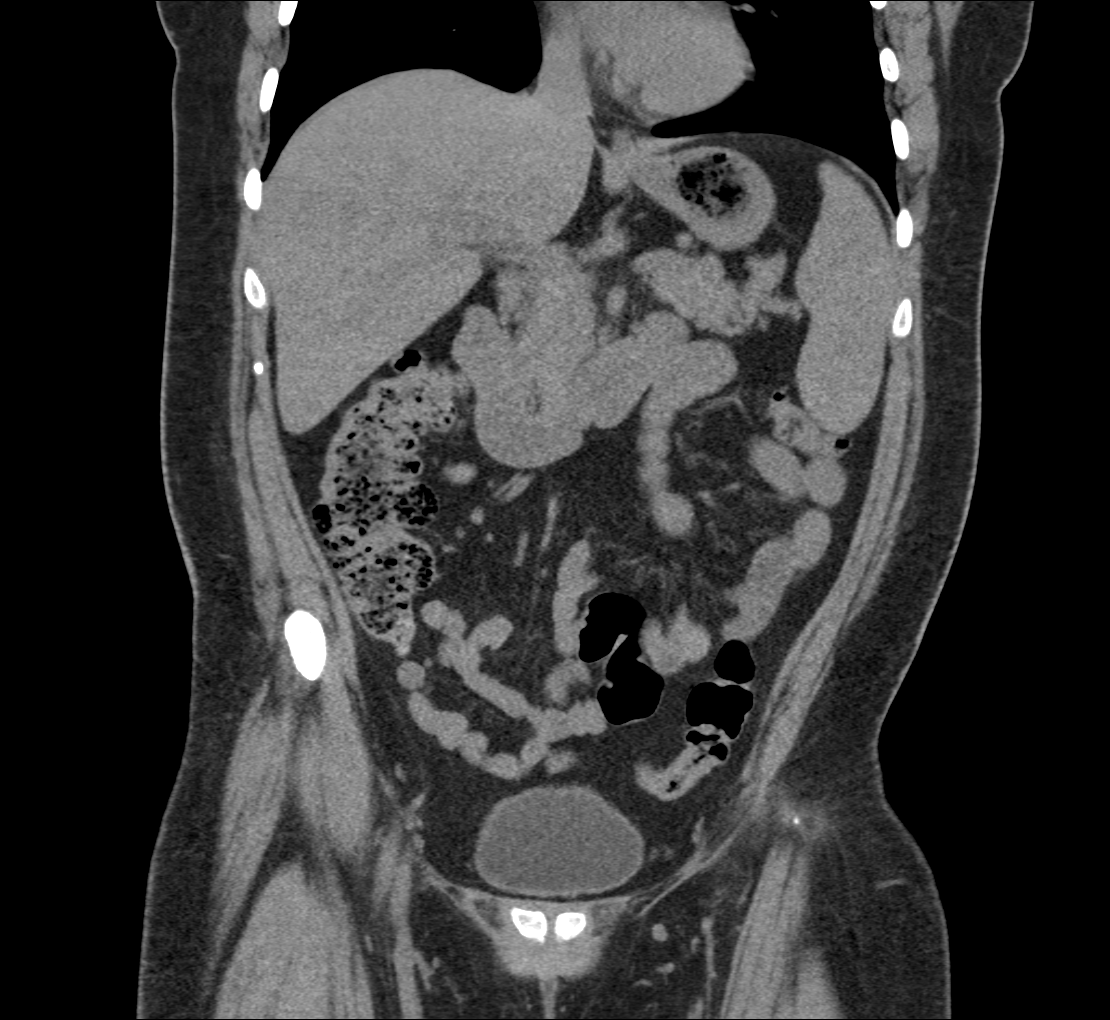
[im 81/146  soft-tissue]
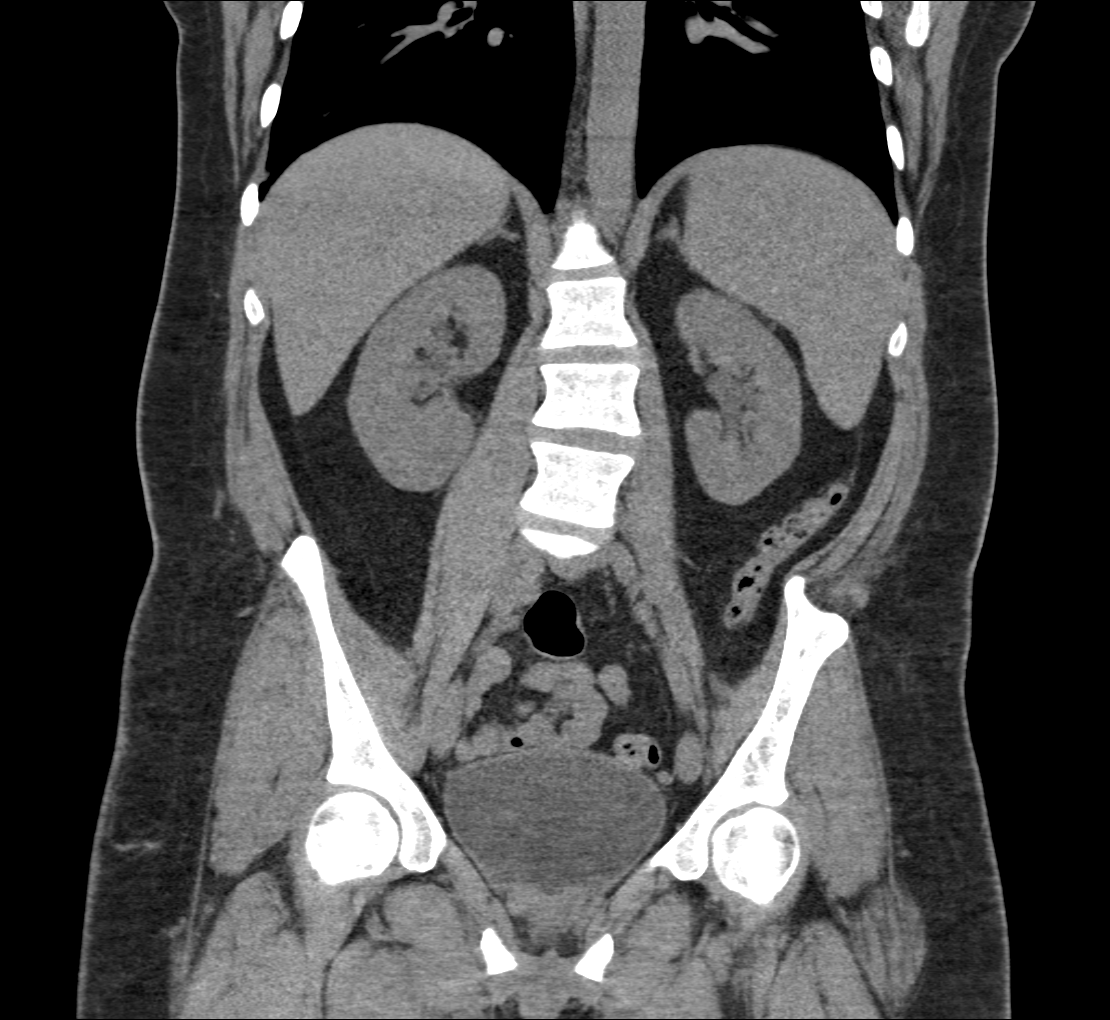

[9 of 46 positions shown; findings below may reference images not displayed]

FINDINGS: Lower chest: Clear lung bases. No significant pleural or pericardial
effusion.

Hepatobiliary: The liver appears unremarkable as imaged in the
noncontrast state. No evidence of gallstones, gallbladder wall
thickening or biliary dilatation.

Pancreas: Unremarkable. No pancreatic ductal dilatation or
surrounding inflammatory changes.

Spleen: Normal in size without focal abnormality.

Adrenals/Urinary Tract: Both adrenal glands appear normal. The
kidneys appear normal without evidence of urinary tract calculus,
suspicious lesion or hydronephrosis. No bladder abnormalities are
seen.

Stomach/Bowel: Bowel assessment limited by the lack of oral and
intravenous contrast. No bowel wall thickening or surrounding
inflammatory change demonstrated. The appendix appears normal.

Vascular/Lymphatic: There are no enlarged abdominal or pelvic lymph
nodes. No significant vascular findings on noncontrast imaging.

Reproductive: There is mild lobulated enlargement of the uterus
which measures 10.9 x 8.2 x 9.0 cm, suspicious for possible
fibroids. No significant adnexal findings are seen.

Other: No evidence of abdominal wall mass or hernia.

Musculoskeletal: No acute osseous findings are seen. There is an
exaggerated lower lumbar lordosis with a probable right-sided L5
pars defect.
IMPRESSION: 1. No evidence of urinary tract calculus or hydronephrosis.
2. No other acute abdominal findings are seen. The appendix appears
normal.
3. Suspected uterine fibroids.
4. Congenital asymmetry of the posterior elements at the lumbosacral
junction with a suspected right-sided L5 pars defect.

## 2017-11-18 MED FILL — ONDANSETRON HCL 4 MG TABLET: 4 | 4 days supply | Qty: 15 | Fill #0

## 2017-11-18 MED FILL — AZITHROMYCIN 500 MG TABLET: 500 | 3 days supply | Qty: 3 | Fill #0

## 2017-11-18 MED FILL — ATOVAQUONE-PROGUANIL 250-10: 250-100 | 20 days supply | Qty: 20 | Fill #0

## 2018-01-19 DIAGNOSIS — D259 Leiomyoma of uterus, unspecified: Secondary | ICD-10-CM | POA: Diagnosis not present

## 2018-01-19 DIAGNOSIS — Z124 Encounter for screening for malignant neoplasm of cervix: Secondary | ICD-10-CM | POA: Diagnosis not present

## 2018-01-19 DIAGNOSIS — Z01411 Encounter for gynecological examination (general) (routine) with abnormal findings: Secondary | ICD-10-CM | POA: Diagnosis not present

## 2018-01-19 DIAGNOSIS — Z6833 Body mass index (BMI) 33.0-33.9, adult: Secondary | ICD-10-CM | POA: Diagnosis not present

## 2018-01-19 DIAGNOSIS — Z1231 Encounter for screening mammogram for malignant neoplasm of breast: Secondary | ICD-10-CM | POA: Diagnosis not present

## 2018-04-15 DIAGNOSIS — Z0000 Encounter for general adult medical examination without abnormal findings: Secondary | ICD-10-CM | POA: Diagnosis not present

## 2018-04-15 DIAGNOSIS — Z13220 Encounter for screening for lipoid disorders: Secondary | ICD-10-CM | POA: Diagnosis not present

## 2018-04-18 DIAGNOSIS — H5213 Myopia, bilateral: Secondary | ICD-10-CM | POA: Diagnosis not present

## 2018-04-18 DIAGNOSIS — H52223 Regular astigmatism, bilateral: Secondary | ICD-10-CM | POA: Diagnosis not present

## 2018-10-04 DIAGNOSIS — J019 Acute sinusitis, unspecified: Secondary | ICD-10-CM | POA: Diagnosis not present

## 2018-10-04 MED FILL — AMOX-CLAV 875-125 MG TABLET: 875-125 | 10 days supply | Qty: 20 | Fill #0

## 2018-10-04 MED FILL — FLUCONAZOLE 150 MG TABS: 150 | 1 days supply | Qty: 1 | Fill #0

## 2018-10-06 MED FILL — DOXYCYCLINE HYCLATE 100 MG: 100 | 8 days supply | Qty: 16 | Fill #0

## 2018-12-09 MED FILL — PREVIDENT 5000 SENSITIVE PA: 1.1-5 | 30 days supply | Qty: 100 | Fill #0

## 2018-12-29 MED FILL — PREVIDENT 5000 SENSITIVE PA: 1.1-5 | 20 days supply | Qty: 100 | Fill #1

## 2019-01-06 MED FILL — AZITHROMYCIN 500 MG TABLET: 500 | 2 days supply | Qty: 4 | Fill #0

## 2019-01-06 MED FILL — ATOVAQUONE-PROGUANIL 250-10: 250-100 | 15 days supply | Qty: 15 | Fill #0

## 2019-01-30 DIAGNOSIS — Z6831 Body mass index (BMI) 31.0-31.9, adult: Secondary | ICD-10-CM | POA: Diagnosis not present

## 2019-01-30 DIAGNOSIS — Z1231 Encounter for screening mammogram for malignant neoplasm of breast: Secondary | ICD-10-CM | POA: Diagnosis not present

## 2019-01-30 DIAGNOSIS — Z01419 Encounter for gynecological examination (general) (routine) without abnormal findings: Secondary | ICD-10-CM | POA: Diagnosis not present

## 2019-04-17 DIAGNOSIS — Z0000 Encounter for general adult medical examination without abnormal findings: Secondary | ICD-10-CM | POA: Diagnosis not present

## 2019-04-17 DIAGNOSIS — E7212 Methylenetetrahydrofolate reductase deficiency: Secondary | ICD-10-CM | POA: Diagnosis not present

## 2019-04-26 DIAGNOSIS — Z0000 Encounter for general adult medical examination without abnormal findings: Secondary | ICD-10-CM | POA: Diagnosis not present

## 2019-04-26 DIAGNOSIS — Z131 Encounter for screening for diabetes mellitus: Secondary | ICD-10-CM | POA: Diagnosis not present

## 2019-04-26 DIAGNOSIS — Z13220 Encounter for screening for lipoid disorders: Secondary | ICD-10-CM | POA: Diagnosis not present

## 2019-05-11 DIAGNOSIS — H5213 Myopia, bilateral: Secondary | ICD-10-CM | POA: Diagnosis not present

## 2019-05-11 DIAGNOSIS — H52223 Regular astigmatism, bilateral: Secondary | ICD-10-CM | POA: Diagnosis not present

## 2019-11-13 MED FILL — PREVIDENT 5000 SENSITIVE PA: 1.1-5 | 20 days supply | Qty: 100 | Fill #2

## 2020-02-28 DIAGNOSIS — Z1231 Encounter for screening mammogram for malignant neoplasm of breast: Secondary | ICD-10-CM | POA: Diagnosis not present

## 2020-02-28 DIAGNOSIS — Z01419 Encounter for gynecological examination (general) (routine) without abnormal findings: Secondary | ICD-10-CM | POA: Diagnosis not present

## 2020-02-28 DIAGNOSIS — Z6834 Body mass index (BMI) 34.0-34.9, adult: Secondary | ICD-10-CM | POA: Diagnosis not present

## 2020-04-17 DIAGNOSIS — E7212 Methylenetetrahydrofolate reductase deficiency: Secondary | ICD-10-CM | POA: Diagnosis not present

## 2020-04-17 DIAGNOSIS — Z13220 Encounter for screening for lipoid disorders: Secondary | ICD-10-CM | POA: Diagnosis not present

## 2020-04-17 DIAGNOSIS — Z0000 Encounter for general adult medical examination without abnormal findings: Secondary | ICD-10-CM | POA: Diagnosis not present

## 2020-07-02 DIAGNOSIS — H52223 Regular astigmatism, bilateral: Secondary | ICD-10-CM | POA: Diagnosis not present

## 2020-07-02 DIAGNOSIS — H5213 Myopia, bilateral: Secondary | ICD-10-CM | POA: Diagnosis not present

## 2020-07-10 MED FILL — SODIUM FLUORIDE 5000 SENSIT: 1.1-5 | 20 days supply | Qty: 100 | Fill #0

## 2020-08-15 DIAGNOSIS — Z1152 Encounter for screening for COVID-19: Secondary | ICD-10-CM | POA: Diagnosis not present

## 2020-09-25 DIAGNOSIS — K5000 Crohn's disease of small intestine without complications: Secondary | ICD-10-CM | POA: Diagnosis not present

## 2021-03-06 DIAGNOSIS — Z6834 Body mass index (BMI) 34.0-34.9, adult: Secondary | ICD-10-CM | POA: Diagnosis not present

## 2021-03-06 DIAGNOSIS — Z01419 Encounter for gynecological examination (general) (routine) without abnormal findings: Secondary | ICD-10-CM | POA: Diagnosis not present

## 2021-03-06 DIAGNOSIS — Z124 Encounter for screening for malignant neoplasm of cervix: Secondary | ICD-10-CM | POA: Diagnosis not present

## 2021-03-06 DIAGNOSIS — Z1231 Encounter for screening mammogram for malignant neoplasm of breast: Secondary | ICD-10-CM | POA: Diagnosis not present

## 2021-03-18 DIAGNOSIS — R300 Dysuria: Secondary | ICD-10-CM | POA: Diagnosis not present

## 2021-08-07 DIAGNOSIS — Z131 Encounter for screening for diabetes mellitus: Secondary | ICD-10-CM | POA: Diagnosis not present

## 2021-08-07 DIAGNOSIS — Z1211 Encounter for screening for malignant neoplasm of colon: Secondary | ICD-10-CM | POA: Diagnosis not present

## 2021-08-07 DIAGNOSIS — Z0000 Encounter for general adult medical examination without abnormal findings: Secondary | ICD-10-CM | POA: Diagnosis not present

## 2021-08-07 DIAGNOSIS — E7212 Methylenetetrahydrofolate reductase deficiency: Secondary | ICD-10-CM | POA: Diagnosis not present

## 2021-08-07 DIAGNOSIS — Z13220 Encounter for screening for lipoid disorders: Secondary | ICD-10-CM | POA: Diagnosis not present

## 2021-11-27 DIAGNOSIS — H5213 Myopia, bilateral: Secondary | ICD-10-CM | POA: Diagnosis not present

## 2021-11-27 DIAGNOSIS — H524 Presbyopia: Secondary | ICD-10-CM | POA: Diagnosis not present

## 2022-03-10 DIAGNOSIS — Z01411 Encounter for gynecological examination (general) (routine) with abnormal findings: Secondary | ICD-10-CM | POA: Diagnosis not present

## 2022-03-10 DIAGNOSIS — Z01419 Encounter for gynecological examination (general) (routine) without abnormal findings: Secondary | ICD-10-CM | POA: Diagnosis not present

## 2022-03-10 DIAGNOSIS — Z1211 Encounter for screening for malignant neoplasm of colon: Secondary | ICD-10-CM | POA: Diagnosis not present

## 2022-03-10 DIAGNOSIS — Z1239 Encounter for other screening for malignant neoplasm of breast: Secondary | ICD-10-CM | POA: Diagnosis not present

## 2022-03-10 DIAGNOSIS — Z1231 Encounter for screening mammogram for malignant neoplasm of breast: Secondary | ICD-10-CM | POA: Diagnosis not present

## 2022-03-10 DIAGNOSIS — Z6834 Body mass index (BMI) 34.0-34.9, adult: Secondary | ICD-10-CM | POA: Diagnosis not present

## 2022-03-26 DIAGNOSIS — Z1211 Encounter for screening for malignant neoplasm of colon: Secondary | ICD-10-CM | POA: Diagnosis not present

## 2022-03-26 DIAGNOSIS — K5904 Chronic idiopathic constipation: Secondary | ICD-10-CM | POA: Diagnosis not present

## 2022-03-26 DIAGNOSIS — E669 Obesity, unspecified: Secondary | ICD-10-CM | POA: Diagnosis not present

## 2022-03-26 DIAGNOSIS — E6609 Other obesity due to excess calories: Secondary | ICD-10-CM | POA: Diagnosis not present

## 2022-03-26 DIAGNOSIS — R748 Abnormal levels of other serum enzymes: Secondary | ICD-10-CM | POA: Diagnosis not present

## 2022-03-26 DIAGNOSIS — Z8379 Family history of other diseases of the digestive system: Secondary | ICD-10-CM | POA: Diagnosis not present

## 2022-03-27 ENCOUNTER — Other Ambulatory Visit (HOSPITAL_COMMUNITY): Payer: Self-pay

## 2022-03-27 MED ORDER — CLENPIQ 10-3.5-12 MG-GM -GM/175ML PO SOLN
ORAL | 0 refills | Status: DC
  Filled 2022-03-27: qty 350, 1d supply, fill #0

## 2022-04-01 DIAGNOSIS — E669 Obesity, unspecified: Secondary | ICD-10-CM | POA: Diagnosis not present

## 2022-04-01 DIAGNOSIS — R748 Abnormal levels of other serum enzymes: Secondary | ICD-10-CM | POA: Diagnosis not present

## 2022-04-08 ENCOUNTER — Other Ambulatory Visit (HOSPITAL_COMMUNITY): Payer: Self-pay

## 2022-04-21 DIAGNOSIS — R760 Raised antibody titer: Secondary | ICD-10-CM | POA: Diagnosis not present

## 2022-04-21 DIAGNOSIS — D123 Benign neoplasm of transverse colon: Secondary | ICD-10-CM | POA: Diagnosis not present

## 2022-04-21 DIAGNOSIS — Z1211 Encounter for screening for malignant neoplasm of colon: Secondary | ICD-10-CM | POA: Diagnosis not present

## 2022-04-21 DIAGNOSIS — R748 Abnormal levels of other serum enzymes: Secondary | ICD-10-CM | POA: Diagnosis not present

## 2022-04-21 DIAGNOSIS — K648 Other hemorrhoids: Secondary | ICD-10-CM | POA: Diagnosis not present

## 2022-04-21 DIAGNOSIS — K635 Polyp of colon: Secondary | ICD-10-CM | POA: Diagnosis not present

## 2022-08-10 DIAGNOSIS — Z13220 Encounter for screening for lipoid disorders: Secondary | ICD-10-CM | POA: Diagnosis not present

## 2022-08-10 DIAGNOSIS — Z0000 Encounter for general adult medical examination without abnormal findings: Secondary | ICD-10-CM | POA: Diagnosis not present

## 2022-09-14 DIAGNOSIS — L814 Other melanin hyperpigmentation: Secondary | ICD-10-CM | POA: Diagnosis not present

## 2022-09-14 DIAGNOSIS — L578 Other skin changes due to chronic exposure to nonionizing radiation: Secondary | ICD-10-CM | POA: Diagnosis not present

## 2022-09-14 DIAGNOSIS — D225 Melanocytic nevi of trunk: Secondary | ICD-10-CM | POA: Diagnosis not present

## 2022-09-14 DIAGNOSIS — D485 Neoplasm of uncertain behavior of skin: Secondary | ICD-10-CM | POA: Diagnosis not present

## 2022-12-23 ENCOUNTER — Other Ambulatory Visit (HOSPITAL_COMMUNITY): Payer: Self-pay

## 2022-12-23 MED ORDER — AZITHROMYCIN 500 MG PO TABS
ORAL_TABLET | ORAL | 0 refills | Status: DC
  Filled 2022-12-23: qty 4, 3d supply, fill #0

## 2022-12-23 MED ORDER — ONDANSETRON HCL 4 MG PO TABS
4.0000 mg | ORAL_TABLET | Freq: Four times a day (QID) | ORAL | 0 refills | Status: DC | PRN
  Filled 2022-12-23: qty 15, 4d supply, fill #0

## 2022-12-23 MED ORDER — ATOVAQUONE-PROGUANIL HCL 250-100 MG PO TABS
1.0000 | ORAL_TABLET | Freq: Every day | ORAL | 0 refills | Status: DC
  Filled 2022-12-23: qty 20, 20d supply, fill #0

## 2023-03-23 ENCOUNTER — Other Ambulatory Visit (HOSPITAL_COMMUNITY): Payer: Self-pay

## 2023-03-23 DIAGNOSIS — R0981 Nasal congestion: Secondary | ICD-10-CM | POA: Diagnosis not present

## 2023-03-23 MED ORDER — PREDNISONE 20 MG PO TABS
ORAL_TABLET | ORAL | 0 refills | Status: DC
  Filled 2023-03-23: qty 12, 6d supply, fill #0

## 2023-03-30 ENCOUNTER — Other Ambulatory Visit (HOSPITAL_COMMUNITY): Payer: Self-pay

## 2023-03-30 MED ORDER — AMOXICILLIN-POT CLAVULANATE 875-125 MG PO TABS
1.0000 | ORAL_TABLET | Freq: Two times a day (BID) | ORAL | 0 refills | Status: DC
  Filled 2023-03-30: qty 20, 10d supply, fill #0

## 2023-04-02 DIAGNOSIS — Z01419 Encounter for gynecological examination (general) (routine) without abnormal findings: Secondary | ICD-10-CM | POA: Diagnosis not present

## 2023-04-02 DIAGNOSIS — Z1231 Encounter for screening mammogram for malignant neoplasm of breast: Secondary | ICD-10-CM | POA: Diagnosis not present

## 2023-08-12 DIAGNOSIS — Z0000 Encounter for general adult medical examination without abnormal findings: Secondary | ICD-10-CM | POA: Diagnosis not present

## 2023-08-12 DIAGNOSIS — Z131 Encounter for screening for diabetes mellitus: Secondary | ICD-10-CM | POA: Diagnosis not present

## 2023-08-12 DIAGNOSIS — E7800 Pure hypercholesterolemia, unspecified: Secondary | ICD-10-CM | POA: Diagnosis not present

## 2023-10-08 DIAGNOSIS — L700 Acne vulgaris: Secondary | ICD-10-CM | POA: Diagnosis not present

## 2023-10-08 DIAGNOSIS — L821 Other seborrheic keratosis: Secondary | ICD-10-CM | POA: Diagnosis not present

## 2023-10-08 DIAGNOSIS — L814 Other melanin hyperpigmentation: Secondary | ICD-10-CM | POA: Diagnosis not present

## 2023-10-08 DIAGNOSIS — D225 Melanocytic nevi of trunk: Secondary | ICD-10-CM | POA: Diagnosis not present

## 2024-03-21 DIAGNOSIS — H524 Presbyopia: Secondary | ICD-10-CM | POA: Diagnosis not present

## 2024-07-29 ENCOUNTER — Encounter (HOSPITAL_BASED_OUTPATIENT_CLINIC_OR_DEPARTMENT_OTHER): Payer: Self-pay

## 2024-07-29 ENCOUNTER — Ambulatory Visit (HOSPITAL_BASED_OUTPATIENT_CLINIC_OR_DEPARTMENT_OTHER)
Admission: EM | Admit: 2024-07-29 | Discharge: 2024-07-29 | Disposition: A | Discharge status: Home / Self Care | Attending: Family Medicine | Admitting: Family Medicine

## 2024-07-29 DIAGNOSIS — M5412 Radiculopathy, cervical region: Secondary | ICD-10-CM | POA: Diagnosis not present

## 2024-07-29 MED ORDER — PREDNISONE 10 MG (21) PO TBPK: ORAL_TABLET | ORAL | 0 refills | Status: AC

## 2024-07-29 MED ORDER — TIZANIDINE HCL 4 MG PO TABS: 4.0000 mg | ORAL_TABLET | Freq: Four times a day (QID) | ORAL | 0 refills | Status: AC | PRN

## 2024-07-29 NOTE — ED Provider Notes (Signed)
 [facility]    CSN: [id number] Arrival date & time: 07/29/24  0945      History   Chief Complaint Chief Complaint  Patient presents with   Neck Pain    HPI ALEIGH GRUNDEN is a 48 y.o. female.   Pt is a 48 year old female that presents with neck pain. States hx of bulging disc to left side of neck. Has been using a weighted vest recently when working out and severe pain to left side of neck has returned. Has needed steroid therapy in the past when she has had these symptoms. Denies numbness/tingling down left arm currently. States the pain has progressed to this in the past. No weakness     Neck Pain   History reviewed. No pertinent past medical history.  Patient Active Problem List   Diagnosis Date Noted   Menorrhagia 09/18/2013    Past Surgical History:  Procedure Laterality Date   ABDOMINOPLASTY     BREAST SURGERY     reduction 07   CESAREAN SECTION     HYSTEROSCOPY WITH THERMACHOICE N/A 09/19/2013   Procedure: THERMACHOICE ABLATION;  Surgeon: Shanda SHAUNNA Muscat, MD;  Location: [facility];  Service: Gynecology;  Laterality: N/A;    OB History     Gravida  4   Para  4   Term  4   Preterm      AB      Living  3      SAB      IAB      Ectopic      Multiple      Live Births  4            Home Medications    Prior to Admission medications   Medication Sig Start Date End Date Taking? Authorizing Provider  predniSONE  (STERAPRED UNI-PAK 21 TAB) 10 MG (21) TBPK tablet Take as dosed on pack 07/29/24  Yes Michaeline Eckersley A, FNP  tiZANidine  (ZANAFLEX ) 4 MG tablet Take 1 tablet (4 mg total) by mouth every 6 (six) hours as needed for muscle spasms. 07/29/24  Yes Jerami Tammen A, FNP  ibuprofen  (ADVIL ,MOTRIN ) 800 MG tablet Take 1 tablet (800 mg total) by mouth 3 (three) times daily. 12/18/15   Zada Elouise BROCKS, PA-C    Family History History reviewed. No pertinent family history.  Social History Social History   Tobacco Use   Smoking status:  Never  Substance Use Topics   Alcohol use: No   Drug use: No     Allergies   Shellfish allergy   Review of Systems Review of Systems  Musculoskeletal:  Positive for neck pain.   See HPI  Physical Exam Triage Vital Signs ED Triage Vitals  Encounter Vitals Group     BP 07/29/24 1015 115/80     Girls Systolic BP Percentile --      Girls Diastolic BP Percentile --      Boys Systolic BP Percentile --      Boys Diastolic BP Percentile --      Pulse Rate 07/29/24 1015 97     Resp 07/29/24 1015 20     Temp 07/29/24 1015 98.6 F (37 C)     Temp Source 07/29/24 1015 Oral     SpO2 07/29/24 1015 98 %     Weight --      Height --      Head Circumference --      Peak Flow --  Pain Score 07/29/24 1018 6     Pain Loc --      Pain Education --      Exclude from Growth Chart --    No data found.  Updated Vital Signs BP 115/80 (BP Location: Right Arm)   Pulse 97   Temp 98.6 F (37 C) (Oral)   Resp 20   LMP 07/03/2024   SpO2 98%   Visual Acuity Right Eye Distance:   Left Eye Distance:   Bilateral Distance:    Right Eye Near:   Left Eye Near:    Bilateral Near:     Physical Exam Vitals and nursing note reviewed.  Constitutional:      General: She is not in acute distress.    Appearance: Normal appearance. She is not ill-appearing, toxic-appearing or diaphoretic.  Neck:   Pulmonary:     Effort: Pulmonary effort is normal.  Musculoskeletal:     Cervical back: Tenderness present. Pain with movement and muscular tenderness present. Decreased range of motion.  Skin:    General: Skin is warm and dry.     Findings: No rash.  Neurological:     Mental Status: She is alert.  Psychiatric:        Mood and Affect: Mood normal.      UC Treatments / Results  Labs (all labs ordered are listed, but only abnormal results are displayed) Labs Reviewed - No data to display  EKG   Radiology No results found.  Procedures Procedures (including critical care  time)  Medications Ordered in UC Medications - No data to display  Initial Impression / Assessment and Plan / UC Course  I have reviewed the triage vital signs and the nursing notes.  Pertinent labs & imaging results that were available during my care of the patient were reviewed by me and considered in my medical decision making (see chart for details).     Cervical radiculitis-looked at previous MRI from approximately 10 years ago or more and patient was diagnosed with bulging disc in C5-C6 and C6-C7.  She has been treated previously with steroids in the past which helped.  Will go ahead and do steroid taper to treat today.  Will also prescribe the low-dose muscle relaxer in the evenings to help with sleep. No red flags  Recommend continue with heat and ice and follow-up with PCP as needed for any continued issues. Final Clinical Impressions(s) / UC Diagnoses   Final diagnoses:  Cervical radiculitis     Discharge Instructions      Medications as prescribed. Follow-up with your doctor if continued issues.  Make sure you are trying to rest the area and alternating heat and ice.     ED Prescriptions     Medication Sig Dispense Auth. Provider   tiZANidine  (ZANAFLEX ) 4 MG tablet Take 1 tablet (4 mg total) by mouth every 6 (six) hours as needed for muscle spasms. 30 tablet Ines Rebel A, FNP   predniSONE  (STERAPRED UNI-PAK 21 TAB) 10 MG (21) TBPK tablet Take as dosed on pack 1 each Ehren Berisha A, FNP      PDMP not reviewed this encounter.   Adah Wilbert LABOR, FNP 07/29/24 1137

## 2024-07-29 NOTE — Discharge Instructions (Signed)
 Medications as prescribed. Follow-up with your doctor if continued issues.  Make sure you are trying to rest the area and alternating heat and ice.

## 2024-07-29 NOTE — ED Triage Notes (Signed)
 States hx of bulging disc to left side of neck. Has been using a weighted vest recently when working out and severe pain to left side of neck has returned. Has needed steroid therapy in the past when she has had these symptoms. Denies numbness/tingling down left arm currently. States the pain has progressed to this in the past.

## 2024-08-17 DIAGNOSIS — E7800 Pure hypercholesterolemia, unspecified: Secondary | ICD-10-CM | POA: Diagnosis not present

## 2024-08-17 DIAGNOSIS — E7212 Methylenetetrahydrofolate reductase deficiency: Secondary | ICD-10-CM | POA: Diagnosis not present

## 2024-08-17 DIAGNOSIS — Z131 Encounter for screening for diabetes mellitus: Secondary | ICD-10-CM | POA: Diagnosis not present

## 2024-08-17 DIAGNOSIS — Z0000 Encounter for general adult medical examination without abnormal findings: Secondary | ICD-10-CM | POA: Diagnosis not present

## 2024-08-21 DIAGNOSIS — Z1231 Encounter for screening mammogram for malignant neoplasm of breast: Secondary | ICD-10-CM | POA: Diagnosis not present

## 2024-12-15 ENCOUNTER — Other Ambulatory Visit (HOSPITAL_COMMUNITY): Payer: Self-pay

## 2024-12-15 MED ORDER — AMOXICILLIN-POT CLAVULANATE 875-125 MG PO TABS
1.0000 | ORAL_TABLET | Freq: Two times a day (BID) | ORAL | 0 refills | Status: AC
  Filled 2024-12-15: qty 20, 10d supply, fill #0
